# Patient Record
Sex: Female | Born: 1935 | Race: White | Hispanic: No
Health system: Southern US, Community
[De-identification: ages and names within clinical notes are randomized; demographics above are authoritative.]

---

## 1999-05-12 ENCOUNTER — Encounter: Payer: Self-pay | Admitting: Neurosurgery

## 1999-05-14 ENCOUNTER — Inpatient Hospital Stay (HOSPITAL_COMMUNITY): Admission: RE | Admit: 1999-05-14 | Discharge: 1999-05-18 | Payer: Self-pay | Admitting: Neurosurgery

## 1999-05-14 ENCOUNTER — Encounter: Payer: Self-pay | Admitting: Neurosurgery

## 2005-03-04 ENCOUNTER — Ambulatory Visit: Payer: Self-pay | Admitting: Internal Medicine

## 2005-11-07 ENCOUNTER — Other Ambulatory Visit: Payer: Self-pay

## 2005-11-07 ENCOUNTER — Inpatient Hospital Stay: Payer: Self-pay | Admitting: Internal Medicine

## 2006-07-19 ENCOUNTER — Ambulatory Visit: Payer: Self-pay | Admitting: Emergency Medicine

## 2007-09-26 IMAGING — CT CT HEAD WITHOUT CONTRAST
2 series · 16 of 30 positions shown, 20 images · non-contrast
Comparison: none

REASON FOR EXAM: unresponsive
COMMENTS:

[Series 2: without · axial · non-contrast · 0.43mm/px · z∈[-108,+12]mm · 13 of 30 slices shown, 17 images]
[im 3/30  brain]
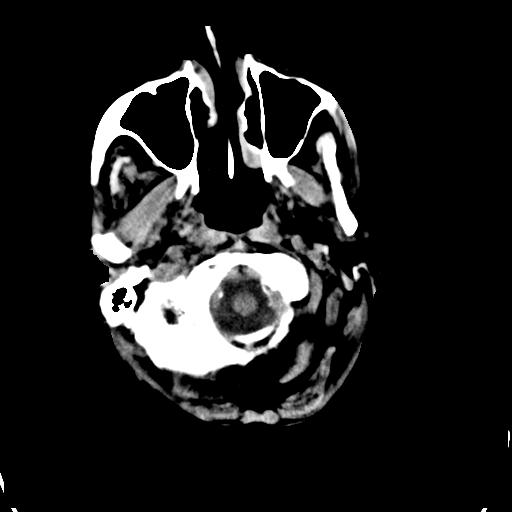
[im 3/30  bone]
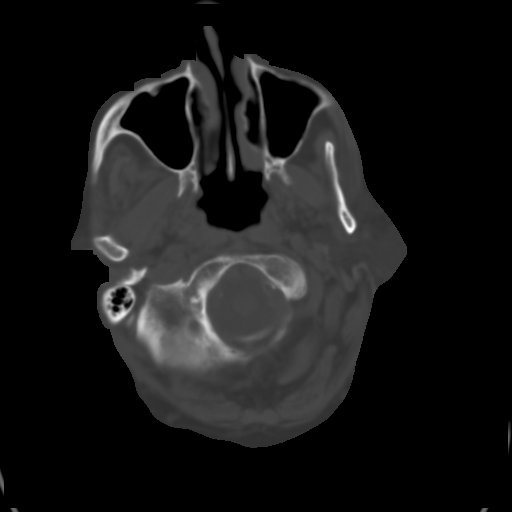
[im 5/30  brain]
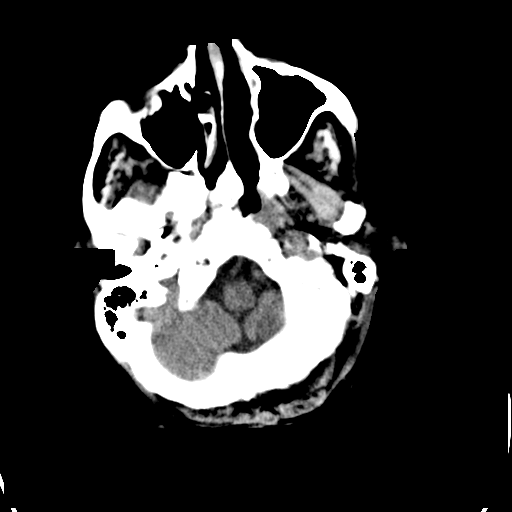
[im 7/30  brain]
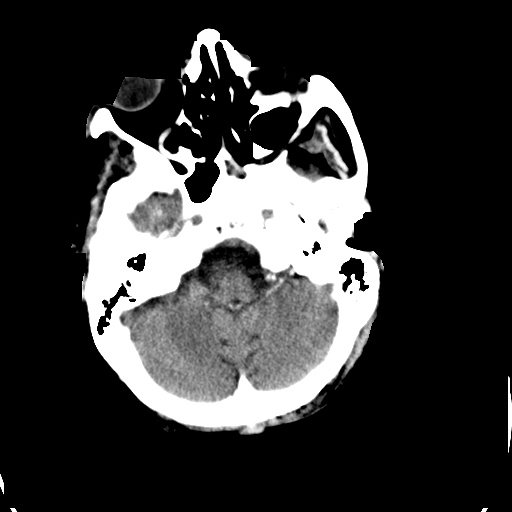
[im 9/30  brain]
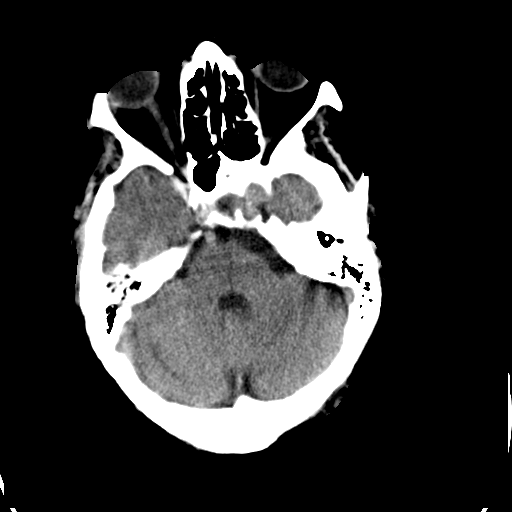
[im 11/30  brain]
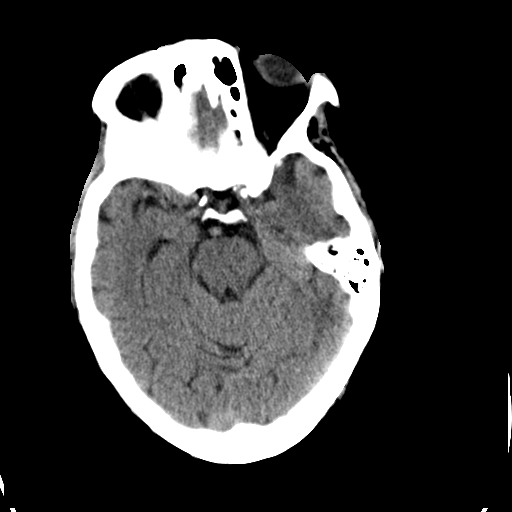
[im 11/30  bone]
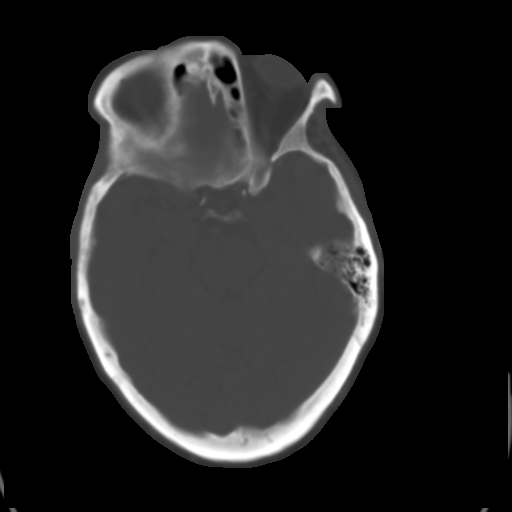
[im 13/30  brain]
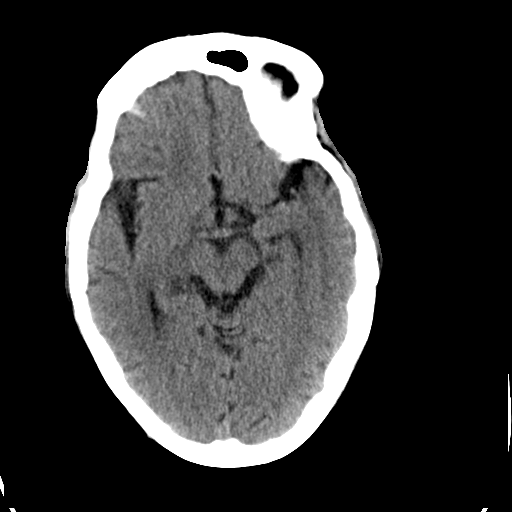
[im 15/30  brain]
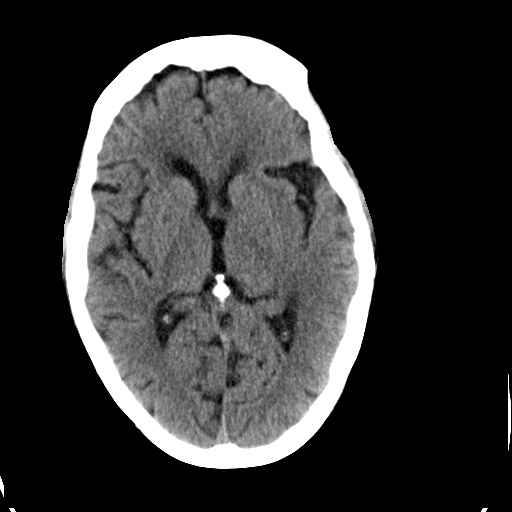
[im 17/30  brain]
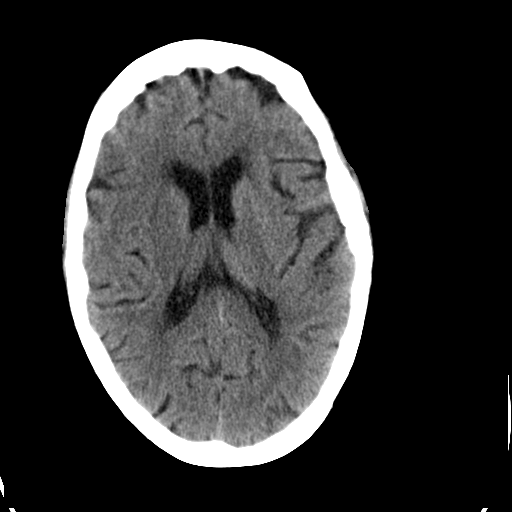
[im 19/30  brain]
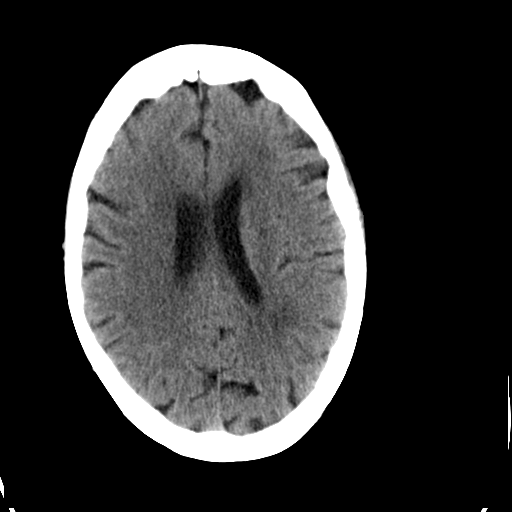
[im 19/30  bone]
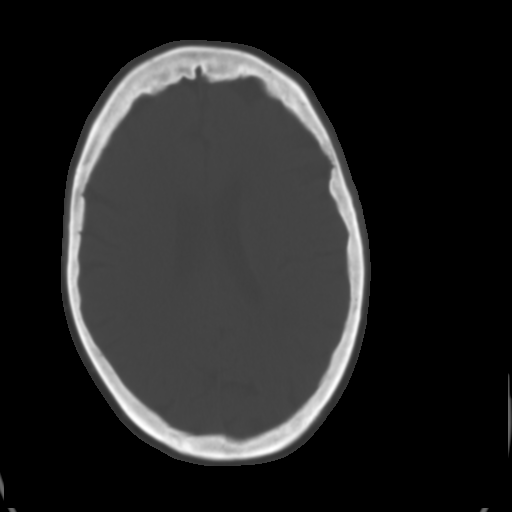
[im 21/30  brain]
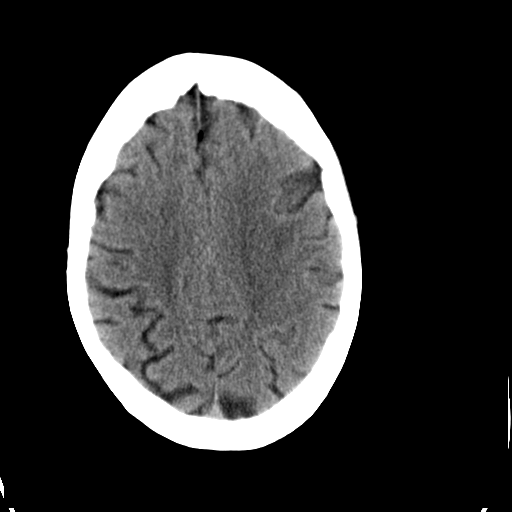
[im 23/30  brain]
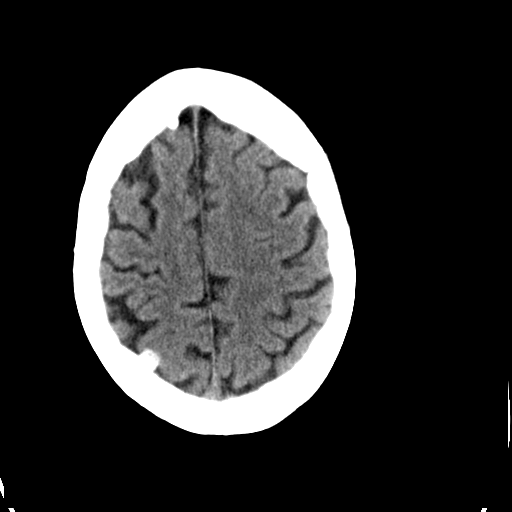
[im 25/30  brain]
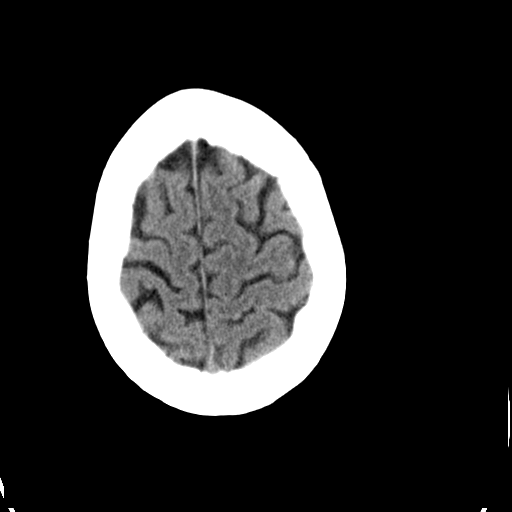
[im 27/30  brain]
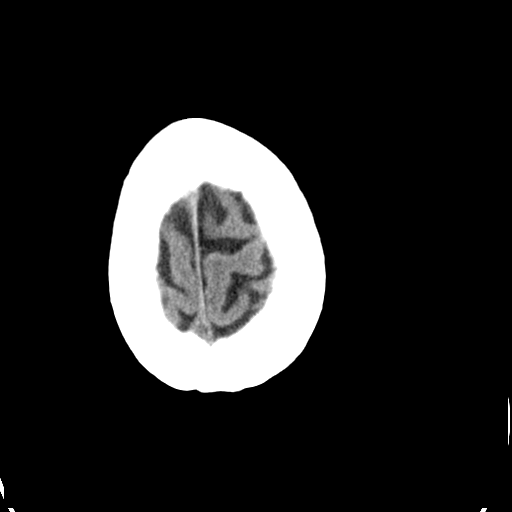
[im 27/30  bone]
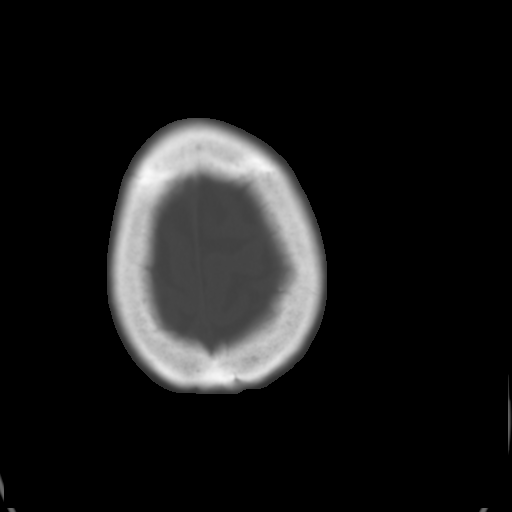

[Series 3: bone · axial · 0.43mm/px · z∈[-108,-68]mm · 3 of 30 slices shown]
[im 3/30  bone]
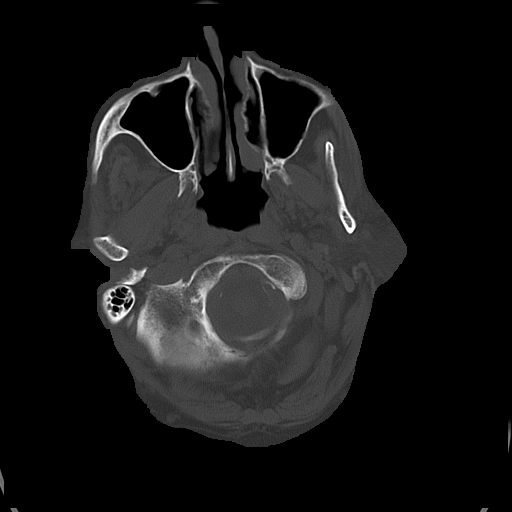
[im 7/30  bone]
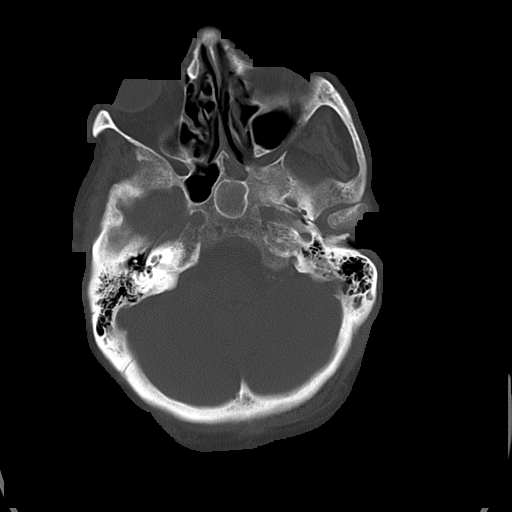
[im 11/30  bone]
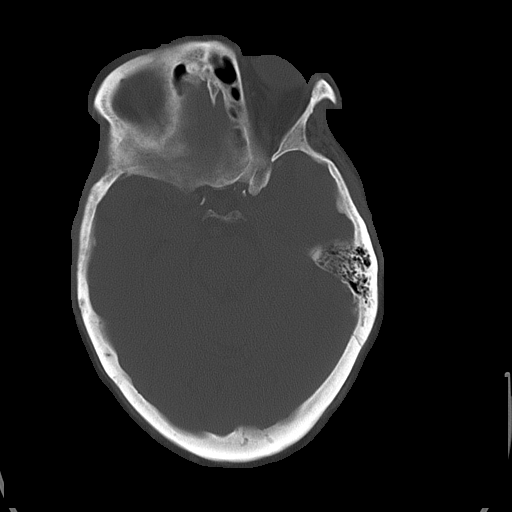

[16 of 30 positions shown; findings below may reference images not displayed]

PROCEDURE:     CT  - CT HEAD WITHOUT CONTRAST  - November 12, 2005  [DATE]

RESULT:          Comparison is made to a study 07 November, 2005.  The
patient is unresponsive.

The ventricles are normal in size and position.  There are mild
age-appropriate atrophic changes present.  There is no evidence of an
intracranial hemorrhage.  I do not see findings to suggest an acute or old
ischemic or hemorrhagic event.  There is stable density that is associated
with the inner table of the posterior parietal bone on the RIGHT.
IMPRESSION: I do not see acute intracranial abnormality.  Mild
age-appropriate atrophy is present.

The findings were called to the floor and given to the patient's nurse, Ms.
Billiot, at the conclusion of the study.

## 2007-10-21 ENCOUNTER — Emergency Department: Payer: Self-pay | Admitting: Emergency Medicine

## 2007-10-22 ENCOUNTER — Other Ambulatory Visit: Payer: Self-pay

## 2007-10-22 ENCOUNTER — Inpatient Hospital Stay: Payer: Self-pay | Admitting: Internal Medicine

## 2008-01-09 ENCOUNTER — Inpatient Hospital Stay: Payer: Self-pay | Admitting: Internal Medicine

## 2009-06-23 ENCOUNTER — Ambulatory Visit: Payer: Self-pay | Admitting: Family Medicine

## 2009-06-24 ENCOUNTER — Ambulatory Visit: Payer: Self-pay | Admitting: Family Medicine

## 2010-07-20 ENCOUNTER — Emergency Department: Payer: Self-pay | Admitting: Emergency Medicine

## 2010-08-03 ENCOUNTER — Ambulatory Visit: Payer: Self-pay | Admitting: Internal Medicine

## 2010-08-09 ENCOUNTER — Ambulatory Visit: Payer: Self-pay | Admitting: Internal Medicine

## 2010-10-26 ENCOUNTER — Ambulatory Visit: Payer: Self-pay | Admitting: Internal Medicine

## 2011-02-14 ENCOUNTER — Ambulatory Visit: Payer: Self-pay | Admitting: Internal Medicine

## 2011-03-19 ENCOUNTER — Emergency Department: Payer: Self-pay | Admitting: Emergency Medicine

## 2011-03-19 LAB — CBC
HCT: 34.1 % — ABNORMAL LOW (ref 35.0–47.0)
HGB: 11.3 g/dL — ABNORMAL LOW (ref 12.0–16.0)
MCH: 27.2 pg (ref 26.0–34.0)
MCHC: 33.1 g/dL (ref 32.0–36.0)
MCV: 82 fL (ref 80–100)
Platelet: 365 10*3/uL (ref 150–440)
RBC: 4.15 10*6/uL (ref 3.80–5.20)
RDW: 13.3 % (ref 11.5–14.5)
WBC: 8.4 10*3/uL (ref 3.6–11.0)

## 2011-03-19 LAB — DRUG SCREEN, URINE

## 2011-03-19 LAB — COMPREHENSIVE METABOLIC PANEL
Albumin: 3.3 g/dL — ABNORMAL LOW (ref 3.4–5.0)
Alkaline Phosphatase: 91 U/L (ref 50–136)
Anion Gap: 9 (ref 7–16)
BUN: 21 mg/dL — ABNORMAL HIGH (ref 7–18)
Bilirubin,Total: 0.3 mg/dL (ref 0.2–1.0)
Calcium, Total: 9.5 mg/dL (ref 8.5–10.1)
Chloride: 108 mmol/L — ABNORMAL HIGH (ref 98–107)
Co2: 28 mmol/L (ref 21–32)
Creatinine: 1.93 mg/dL — ABNORMAL HIGH (ref 0.60–1.30)
EGFR (African American): 32 — ABNORMAL LOW
EGFR (Non-African Amer.): 27 — ABNORMAL LOW
Glucose: 105 mg/dL — ABNORMAL HIGH (ref 65–99)
Osmolality: 292 (ref 275–301)
Potassium: 4.5 mmol/L (ref 3.5–5.1)
SGOT(AST): 16 U/L (ref 15–37)
SGPT (ALT): 17 U/L
Sodium: 145 mmol/L (ref 136–145)
Total Protein: 7.6 g/dL (ref 6.4–8.2)

## 2011-03-19 LAB — URINALYSIS, COMPLETE
Bacteria: NONE SEEN
Bilirubin,UR: NEGATIVE
Blood: NEGATIVE
Glucose,UR: NEGATIVE mg/dL (ref 0–75)
Ketone: NEGATIVE
Nitrite: NEGATIVE
Ph: 6 (ref 4.5–8.0)
Protein: NEGATIVE
RBC,UR: 5 /HPF (ref 0–5)
Specific Gravity: 1.006 (ref 1.003–1.030)
Squamous Epithelial: 5
Transitional Epi: 1
WBC UR: 20 /HPF (ref 0–5)

## 2011-03-19 LAB — VALPROIC ACID LEVEL: Valproic Acid: 39 ug/mL — ABNORMAL LOW

## 2011-03-19 LAB — TROPONIN I: Troponin-I: 0.03 ng/mL

## 2011-03-19 LAB — LIPASE, BLOOD: Lipase: 145 U/L (ref 73–393)
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
## 2013-01-30 IMAGING — CR DG CHEST 1V
1 series · 1 of 1 positions shown · non-contrast
Comparison: none

REASON FOR EXAM: AMS
COMMENTS:

PROCEDURE:     DXR - DXR CHEST 1 VIEWAP OR PA  - March 19, 2011  [DATE]
RESULT:     Comparison: None

[w chest pa]
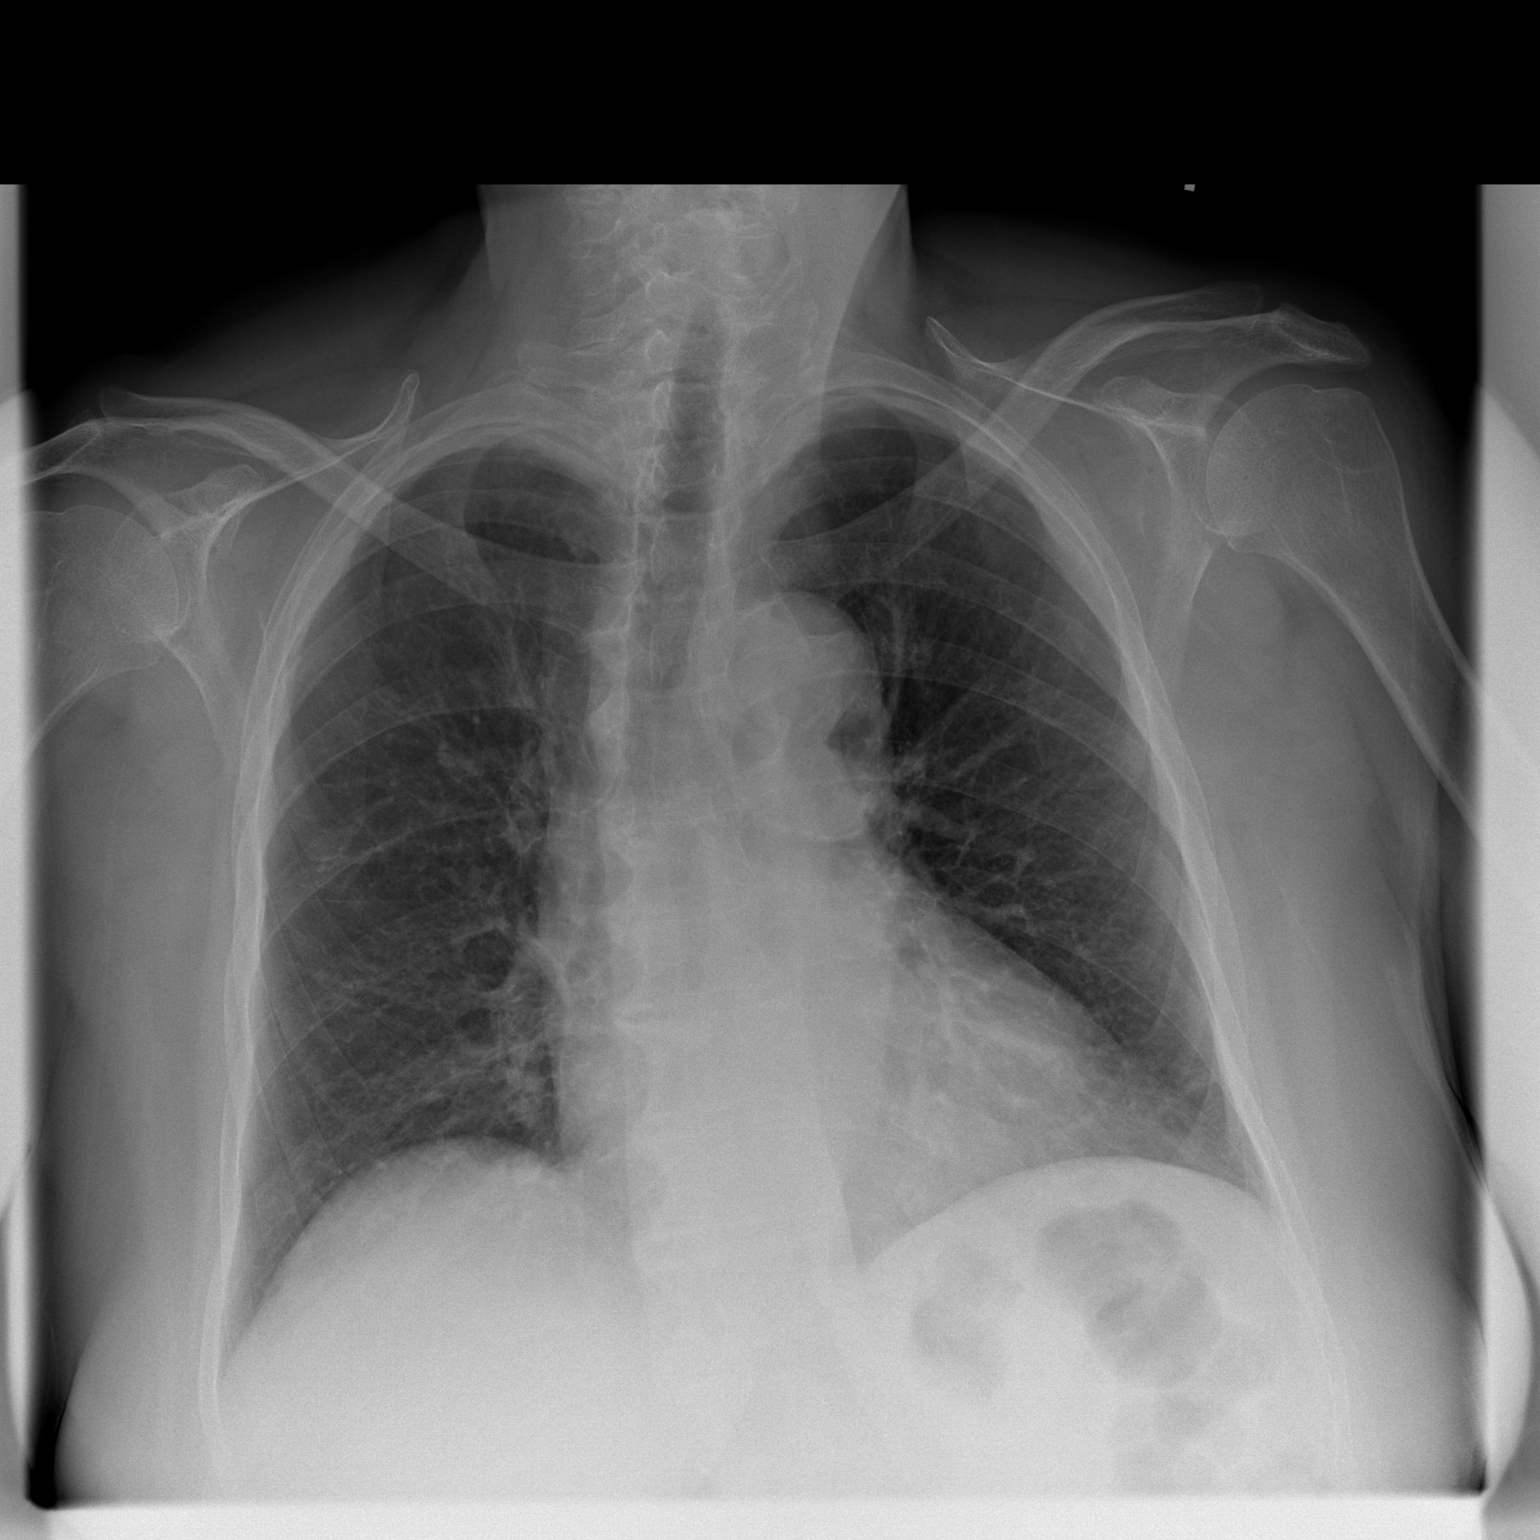

[1 of 1 positions shown; findings below may reference images not displayed]

FINDINGS: Single portable AP chest radiograph is provided.  There is no focal
parenchymal opacity, pleural effusion, or pneumothorax. Normal
cardiomediastinal silhouette. The osseous structures are unremarkable.
IMPRESSION: No acute disease of the che[REDACTED]

## 2014-05-06 NOTE — Consult Note (Signed)
Details:    - Psychiatry: No new complaints. Calm and not currently threatening but still a little hyper at times. Cant go back to group home. Has been turned down by local gero units due to medical concern. [facility]. Patient agreeable. Tolerating medication. Nop further changes today.   Electronic Signatures: Audery Amellapacs, John T (MD)  (Signed 08-Mar-13 16:39)  Authored: Details   Last Updated: 08-Mar-13 16:39 by Audery Amellapacs, John T (MD)
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
